# Patient Record
Sex: Male | Born: 1935 | Race: White | Hispanic: No | Smoking: Former smoker
Health system: Southern US, Community
[De-identification: ages and names within clinical notes are randomized; demographics above are authoritative.]

## PROBLEM LIST (undated history)

## (undated) DIAGNOSIS — I214 Non-ST elevation (NSTEMI) myocardial infarction: Secondary | ICD-10-CM

## (undated) DIAGNOSIS — I2510 Atherosclerotic heart disease of native coronary artery without angina pectoris: Secondary | ICD-10-CM

## (undated) DIAGNOSIS — I10 Essential (primary) hypertension: Secondary | ICD-10-CM

## (undated) DIAGNOSIS — F0390 Unspecified dementia without behavioral disturbance: Secondary | ICD-10-CM

## (undated) DIAGNOSIS — I429 Cardiomyopathy, unspecified: Secondary | ICD-10-CM

## (undated) HISTORY — DX: Non-ST elevation (NSTEMI) myocardial infarction: I21.4

## (undated) HISTORY — PX: APPENDECTOMY: SHX54

## (undated) HISTORY — DX: Cardiomyopathy, unspecified: I42.9

## (undated) HISTORY — DX: Unspecified dementia, unspecified severity, without behavioral disturbance, psychotic disturbance, mood disturbance, and anxiety: F03.90

## (undated) HISTORY — DX: Essential (primary) hypertension: I10

## (undated) HISTORY — DX: Atherosclerotic heart disease of native coronary artery without angina pectoris: I25.10

---

## 2012-03-27 HISTORY — PX: CORONARY ARTERY BYPASS GRAFT: SHX141

## 2012-04-16 ENCOUNTER — Encounter: Payer: Self-pay | Admitting: Cardiology

## 2012-04-17 ENCOUNTER — Encounter: Payer: Self-pay | Admitting: Cardiology

## 2012-04-19 DIAGNOSIS — I214 Non-ST elevation (NSTEMI) myocardial infarction: Secondary | ICD-10-CM

## 2012-04-19 HISTORY — DX: Non-ST elevation (NSTEMI) myocardial infarction: I21.4

## 2012-06-25 ENCOUNTER — Encounter: Payer: Self-pay | Admitting: Cardiology

## 2012-06-26 ENCOUNTER — Encounter: Payer: Self-pay | Admitting: Physician Assistant

## 2012-07-11 ENCOUNTER — Encounter: Payer: Self-pay | Admitting: Cardiology

## 2012-07-12 ENCOUNTER — Ambulatory Visit (INDEPENDENT_AMBULATORY_CARE_PROVIDER_SITE_OTHER): Payer: Medicare Other | Admitting: Cardiology

## 2012-07-12 ENCOUNTER — Encounter: Payer: Self-pay | Admitting: Cardiology

## 2012-07-12 VITALS — BP 151/83 | HR 64 | Ht 70.0 in | Wt 180.8 lb

## 2012-07-12 DIAGNOSIS — I10 Essential (primary) hypertension: Secondary | ICD-10-CM

## 2012-07-12 DIAGNOSIS — R9389 Abnormal findings on diagnostic imaging of other specified body structures: Secondary | ICD-10-CM

## 2012-07-12 DIAGNOSIS — I2789 Other specified pulmonary heart diseases: Secondary | ICD-10-CM

## 2012-07-12 DIAGNOSIS — R0602 Shortness of breath: Secondary | ICD-10-CM

## 2012-07-12 DIAGNOSIS — I429 Cardiomyopathy, unspecified: Secondary | ICD-10-CM

## 2012-07-12 DIAGNOSIS — I272 Pulmonary hypertension, unspecified: Secondary | ICD-10-CM

## 2012-07-12 DIAGNOSIS — I2510 Atherosclerotic heart disease of native coronary artery without angina pectoris: Secondary | ICD-10-CM | POA: Insufficient documentation

## 2012-07-12 NOTE — Patient Instructions (Addendum)
Your physician recommends that you schedule a follow-up appointment in: 3 months. Your physician recommends that you continue on your current medications as directed. Please refer to the Current Medication list given to you today. You have been referred to Dr. Orson Aloe for a pulmonary evaluation.

## 2012-07-12 NOTE — Progress Notes (Signed)
Clinical Summary Jose Henry is an 79 y.o.male referred to the office as a new patient by Jose Henry. He had had prior assessment by Jose Henry with an [facility] beginning in January when the patient presented with an out-of-hospital NSTEMI. He was managed conservatively and had a rest redistribution thallium study that per Jose Henry chart note indicated viable myocardium within the lateral wall, 50% tracer uptake in the inferior wall without redistribution. Unfortunately there is no formal report for me to review. Echocardiogram in January demonstrated LVEF 35-40% with inferolateral akinetic segments, mild left atrial enlargement, no significant MR, mild to moderate TR, RVSP 55-60 mm mercury.  Patient was transferred to [facility] and underwent cardiac catheterization demonstrating multivessel disease. This was followed by CABG including LIMA to the LAD, SVG to the first diagonal, and SVG to the first obtuse marginal with Jose Henry. He had a visit with Jose Henry in March and was referred for a followup echocardiogram that fortunately showed improvement in LVEF the range of 55-60%. He did have diastolic dysfunction, mild left atrial enlargement, severe right atrial enlargement, moderate to severe tricuspid regurgitation, and RVSP 50 mm mercury. Chest CT and examined the chest around that time did not demonstrate pulmonary embolus, but there were bilateral calcified pleural plaques suggestive of asbestos exposure.  Patient is here with a family member today. He indicates that he has been doing reasonably well. Has had some recent "cold" symptoms with intermittent wheezing. No productive cough. He reports compliance with his medications.   Allergies  Allergen Reactions  . Penicillins     Current Outpatient Prescriptions  Medication Sig Dispense Refill  . alfuzosin (UROXATRAL) 10 MG 24 hr tablet Take 10 mg by mouth at bedtime.      Marland Kitchen aspirin 325 MG tablet Take 325 mg by mouth  daily.      Marland Kitchen atorvastatin (LIPITOR) 80 MG tablet Take 80 mg by mouth daily.      . beta carotene w/minerals (OCUVITE) tablet Take 1 tablet by mouth daily.      Marland Kitchen donepezil (ARICEPT) 5 MG tablet Take 5 mg by mouth daily.      . ferrous gluconate (FERGON) 324 MG tablet Take 684 mg by mouth daily.      Marland Kitchen guaiFENesin-codeine (ROBITUSSIN AC) 100-10 MG/5ML syrup Take 5 mLs by mouth 4 (four) times daily as needed for cough.      Marland Kitchen lisinopril (PRINIVIL,ZESTRIL) 2.5 MG tablet Take 2.5 mg by mouth daily.      . metFORMIN (GLUCOPHAGE) 500 MG tablet Take 500 mg by mouth daily.      . metoprolol tartrate (LOPRESSOR) 25 MG tablet Take 75 mg by mouth 2 (two) times daily.      . sennosides-docusate sodium (SENOKOT-S) 8.6-50 MG tablet Take 2 tablets by mouth at bedtime.      . traMADol (ULTRAM) 50 MG tablet Take 50 mg by mouth every 6 (six) hours as needed for pain.      Marland Kitchen zolpidem (AMBIEN) 5 MG tablet Take 5 mg by mouth at bedtime as needed for sleep.       No current facility-administered medications for this visit.    Past Medical History  Diagnosis Date  . Essential hypertension, benign   . NSTEMI (non-ST elevated myocardial infarction) 04/19/2012    Out of hospital  . Dementia   . Cardiomyopathy     LVEF 35-40% initially  . Coronary atherosclerosis of native coronary artery     Multivessel  Past Surgical History  Procedure Laterality Date  . Appendectomy    . Coronary artery bypass graft  1/14     [facility] - LIMA to LAD, SVG to D1, SVG to OM1     Family History  Problem Relation Age of Onset  . Hypertension      Social History Jose Henry reports that he has quit smoking. His smoking use included Cigarettes. He smoked 0.00 packs per day. He does not have any smokeless tobacco history on file. Jose Henry reports that he does not drink alcohol.  Review of Systems No palpitations, no recent falls. Has to walk slowly intermittent unsteadiness. No  orthopnea or PND. Stable appetite. Hard of hearing. Otherwise negative.  Physical Examination Filed Vitals:   07/12/12 1003  BP: 151/83  Pulse: 64   Filed Weights   07/12/12 1003  Weight: 180 lb 12.8 oz (82.01 kg)   No acute distress. HEENT: Conjunctiva and lids normal, oropharynx clear. Neck: Supple, no elevated JVP, no thyromegaly. Lungs: Course, decreased throughout, nonlabored breathing at rest. Cardiac: Regular rate and rhythm, no S3, soft systolic murmur, no pericardial rub. Abdomen: Soft, nontender, bowel sounds present. Extremities: Trace edema, distal pulses 1-2+. Skin: Warm and dry. Musculoskeletal: No kyphosis. Neuropsychiatric: Alert and oriented x3, affect grossly appropriate.   Problem List and Plan   Coronary atherosclerosis of native coronary artery Extensive history reviewed today. Patient seems to be reasonably stable at this time on medical therapy. Medical regimen is reasonable, and he has had documentation of improved LVEF following revascularization. Plan will be continued activity as tolerated, follow up in 3 months.  Secondary cardiomyopathy, unspecified Recent echocardiogram shows improved LVEF up to the range of 55-60%.  Essential hypertension, benign Continue medical therapy and followup with Jose Henry.  Pulmonary hypertension Noted by echocardiogram, patient with moderate to severe TR, recent chest CTA not demonstrating any pulmonary embolus, but showing bilateral calcified pleural plaques. 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 No definite asbestos exposure. Plan to refer him for formal pulmonary consultation with Jose Henry.    Jose Henry, M.D., F.A.C.C.

## 2012-07-12 NOTE — Assessment & Plan Note (Signed)
Recent echocardiogram shows improved LVEF up to the range of 55-60%.

## 2012-07-12 NOTE — Assessment & Plan Note (Signed)
Noted by echocardiogram, patient with moderate to severe TR, recent chest CTA not demonstrating any pulmonary embolus, but showing bilateral calcified pleural plaques. 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 No definite asbestos exposure. Plan to refer him for formal pulmonary consultation with Dr. Orson Aloe.

## 2012-07-12 NOTE — Assessment & Plan Note (Signed)
Extensive history reviewed today. Patient seems to be reasonably stable at this time on medical therapy. Medical regimen is reasonable, and he has had documentation of improved LVEF following revascularization. Plan will be continued activity as tolerated, follow up in 3 months.

## 2012-07-12 NOTE — Assessment & Plan Note (Signed)
Continue medical therapy and followup with Dr. Sherryll Burger.

## 2012-10-11 ENCOUNTER — Ambulatory Visit (INDEPENDENT_AMBULATORY_CARE_PROVIDER_SITE_OTHER): Payer: Medicare Other | Admitting: Cardiology

## 2012-10-11 ENCOUNTER — Encounter: Payer: Self-pay | Admitting: Cardiology

## 2012-10-11 VITALS — BP 169/85 | HR 68 | Ht 70.0 in | Wt 189.4 lb

## 2012-10-11 DIAGNOSIS — I2789 Other specified pulmonary heart diseases: Secondary | ICD-10-CM

## 2012-10-11 DIAGNOSIS — Z01810 Encounter for preprocedural cardiovascular examination: Secondary | ICD-10-CM

## 2012-10-11 DIAGNOSIS — I10 Essential (primary) hypertension: Secondary | ICD-10-CM

## 2012-10-11 DIAGNOSIS — I2510 Atherosclerotic heart disease of native coronary artery without angina pectoris: Secondary | ICD-10-CM

## 2012-10-11 DIAGNOSIS — I272 Pulmonary hypertension, unspecified: Secondary | ICD-10-CM

## 2012-10-11 NOTE — Assessment & Plan Note (Signed)
Blood pressure is up today. Keep visits with Dr. Sherryll Burger. May need further up titration of ACE inhibitor.

## 2012-10-11 NOTE — Patient Instructions (Addendum)
Your physician recommends that you schedule a follow-up appointment in: 4 months. You will receive a reminder letter in the mail in about 1-2 months reminding you to call and schedule your appointment. If you don't receive this letter, please contact our office. Your physician recommends that you continue on your current medications as directed. Please refer to the Current Medication list given to you today. 

## 2012-10-11 NOTE — Progress Notes (Signed)
,   Clinical Summary [name] is an 79 y.o.male last seen back in April. He is here with a family member. He reports no angina symptoms, chronic shortness of breath, has a pending visit for pulmonary evaluation with Dr. Orson Henry next week.  He has assistance with his medications, reports compliance overall. He is having some trouble with sinus congestion and rhinorrhea. No cough, fevers or chills.  Echocardiogram from March of this year showed improved LVEF up to the range of 55-60%.  I received communication from [employer] at [employer] [id number]) regarding need for patient to undergo extraction of tooth #11. My presumption is this is going to be done using a regional anesthetic, although this was not outlined.   Allergies  Allergen Reactions  . Penicillins     Current Outpatient Prescriptions  Medication Sig Dispense Refill  . alfuzosin (UROXATRAL) 10 MG 24 hr tablet Take 10 mg by mouth at bedtime.      Marland Kitchen aspirin 325 MG tablet Take 325 mg by mouth daily.      Marland Kitchen atorvastatin (LIPITOR) 80 MG tablet Take 80 mg by mouth daily.      . beta carotene w/minerals (OCUVITE) tablet Take 1 tablet by mouth daily.      Marland Kitchen donepezil (ARICEPT) 5 MG tablet Take 5 mg by mouth daily.      . ferrous gluconate (FERGON) 324 MG tablet Take 684 mg by mouth daily.      Marland Kitchen guaiFENesin-codeine (ROBITUSSIN AC) 100-10 MG/5ML syrup Take 5 mLs by mouth 4 (four) times daily as needed for cough.      Marland Kitchen lisinopril (PRINIVIL,ZESTRIL) 2.5 MG tablet Take 2.5 mg by mouth daily.      . metFORMIN (GLUCOPHAGE) 500 MG tablet Take 500 mg by mouth daily.      . metoprolol tartrate (LOPRESSOR) 25 MG tablet Take 75 mg by mouth 2 (two) times daily.      . sennosides-docusate sodium (SENOKOT-S) 8.6-50 MG tablet Take 2 tablets by mouth at bedtime.      . traMADol (ULTRAM) 50 MG tablet Take 50 mg by mouth every 6 (six) hours as needed for pain.      . VENTOLIN HFA 108 (90 BASE) MCG/ACT inhaler Inhale 2 puffs  into the lungs every 6 (six) hours as needed.       . zolpidem (AMBIEN) 5 MG tablet Take 5 mg by mouth at bedtime as needed for sleep.       No current facility-administered medications for this visit.    Past Medical History  Diagnosis Date  . Essential hypertension, benign   . NSTEMI (non-ST elevated myocardial infarction) 04/19/2012    Out of hospital  . Dementia   . Cardiomyopathy     LVEF 35-40% initially  . Coronary atherosclerosis of native coronary artery     Multivessel    Past Surgical History  Procedure Laterality Date  . Appendectomy    . Coronary artery bypass graft  1/14     [facility] - LIMA to LAD, SVG to D1, SVG to OM1     Social History [name] reports that he has quit smoking. His smoking use included Cigarettes. He smoked 0.00 packs per day. He does not have any smokeless tobacco history on file. [name] reports that he does not drink alcohol.  Review of Systems No palpitations or syncope. No reported bleeding problems. Stable appetite. Difficulty sleeping. Otherwise negative.  Physical Examination Filed Vitals:   10/11/12  1434  BP: 169/85  Pulse: 68   Filed Weights   10/11/12 1434  Weight: 189 lb 6.4 oz (85.911 kg)    Comfortable at rest. HEENT: Conjunctiva and lids normal, oropharynx clear.  Neck: Supple, no elevated JVP, no thyromegaly.  Lungs: Course, decreased throughout, nonlabored breathing at rest.  Cardiac: Regular rate and rhythm, no S3, soft systolic murmur, no pericardial rub.  Abdomen: Soft, nontender, bowel sounds present.  Extremities: Trace edema, distal pulses 1-2+.  Skin: Warm and dry.  Musculoskeletal: No kyphosis.  Neuropsychiatric: Alert and oriented x3, affect grossly appropriate. Hard of hearing.  Problem List and Plan   Coronary atherosclerosis of native coronary artery Symptomatically stable, status post CABG with subsequent improvement in LV function to normal range assessed  by echocardiography in March of this year. Plan is to continue current medical regimen. Followup arranged.  Preoperative cardiovascular examination As noted above, patient is being considered for tooth extraction at [facility], presumably with a regional anesthetic. Generally this should be low risk as relates to cardiac disease. He does not require any prophylactic antibiotics status post CABG. If aspirin needs to be held temporary, this would also be reasonable. Would not anticipate any specific cardiac testing at this time.  Essential hypertension, benign Blood pressure is up today. Keep visits with Dr. Sherryll Henry. May need further up titration of ACE inhibitor.  Pulmonary hypertension Patient has pending visit with Dr. Orson Henry to assess pulmonary status.    Jonelle Sidle, M.D., F.A.C.C.

## 2012-10-11 NOTE — Assessment & Plan Note (Signed)
Patient has pending visit with Dr. Orson Aloe to assess pulmonary status.

## 2012-10-11 NOTE — Assessment & Plan Note (Signed)
Symptomatically stable, status post CABG with subsequent improvement in LV function to normal range assessed by echocardiography in March of this year. Plan is to continue current medical regimen. Followup arranged.

## 2012-10-11 NOTE — Assessment & Plan Note (Signed)
As noted above, patient is being considered for tooth extraction at [facility], presumably with a regional anesthetic. Generally this should be low risk as relates to cardiac disease. He does not require any prophylactic antibiotics status post CABG. If aspirin needs to be held temporary, this would also be reasonable. Would not anticipate any specific cardiac testing at this time.

## 2013-02-19 ENCOUNTER — Encounter: Payer: Self-pay | Admitting: Cardiology

## 2013-02-19 ENCOUNTER — Encounter: Payer: Medicare Other | Admitting: Cardiology

## 2013-02-19 NOTE — Progress Notes (Signed)
No show  This encounter was created in error - please disregard.

## 2013-04-24 ENCOUNTER — Ambulatory Visit (INDEPENDENT_AMBULATORY_CARE_PROVIDER_SITE_OTHER): Payer: Medicare Other | Admitting: Cardiology

## 2013-04-24 ENCOUNTER — Encounter: Payer: Self-pay | Admitting: Cardiology

## 2013-04-24 VITALS — BP 167/86 | HR 53 | Ht 70.0 in | Wt 194.0 lb

## 2013-04-24 DIAGNOSIS — I429 Cardiomyopathy, unspecified: Secondary | ICD-10-CM

## 2013-04-24 DIAGNOSIS — I2510 Atherosclerotic heart disease of native coronary artery without angina pectoris: Secondary | ICD-10-CM

## 2013-04-24 NOTE — Assessment & Plan Note (Signed)
Multivessel disease status post previous CABG, continue medical therapy and observation.

## 2013-04-24 NOTE — Progress Notes (Signed)
Clinical Summary Mr. Mayford Knifeurner is an 80 y.o.male last seen in July 2014. He is here with a family member today. He reports no chest pain, has medication assistance from family members. No hospitalizations since prior visit. Just had a recent office visit with Dr. Sherryll BurgerShah.  Echocardiogram from March 2014 showed improved LVEF up to the range of 55-60%. ECG today shows sinus bradycardia with prolonged PR interval.  We continue conservative medical management of his cardiac disease.   Allergies  Allergen Reactions  . Penicillins     Current Outpatient Prescriptions  Medication Sig Dispense Refill  . alfuzosin (UROXATRAL) 10 MG 24 hr tablet Take 10 mg by mouth at bedtime.      Marland Kitchen. aspirin 325 MG tablet Take 325 mg by mouth daily.      Marland Kitchen. atorvastatin (LIPITOR) 80 MG tablet Take 80 mg by mouth daily.      . beta carotene w/minerals (OCUVITE) tablet Take 1 tablet by mouth daily.      Marland Kitchen. donepezil (ARICEPT) 5 MG tablet Take 5 mg by mouth daily.      . ferrous gluconate (FERGON) 324 MG tablet Take 684 mg by mouth daily.      Marland Kitchen. guaiFENesin-codeine (ROBITUSSIN AC) 100-10 MG/5ML syrup Take 5 mLs by mouth 4 (four) times daily as needed for cough.      Marland Kitchen. lisinopril (PRINIVIL,ZESTRIL) 2.5 MG tablet Take 2.5 mg by mouth daily.      . metFORMIN (GLUCOPHAGE) 500 MG tablet Take 500 mg by mouth daily.      . metoprolol tartrate (LOPRESSOR) 25 MG tablet Take 75 mg by mouth 2 (two) times daily.      Marland Kitchen. NAMENDA 10 MG tablet       . sennosides-docusate sodium (SENOKOT-S) 8.6-50 MG tablet Take 2 tablets by mouth at bedtime.      . traMADol (ULTRAM) 50 MG tablet Take 50 mg by mouth every 6 (six) hours as needed for pain.      . VENTOLIN HFA 108 (90 BASE) MCG/ACT inhaler Inhale 2 puffs into the lungs every 6 (six) hours as needed.       . zolpidem (AMBIEN) 5 MG tablet Take 5 mg by mouth at bedtime as needed for sleep.       No current facility-administered medications for this visit.    Past Medical History    Diagnosis Date  . Essential hypertension, benign   . NSTEMI (non-ST elevated myocardial infarction) 04/19/2012    Out of hospital  . Dementia   . Cardiomyopathy     LVEF 35-40% initially  . Coronary atherosclerosis of native coronary artery     Multivessel    Past Surgical History  Procedure Laterality Date  . Appendectomy    . Coronary artery bypass [facility]  [facility]14     [facility] L[facility], SVG to D1, SVG to OM1     Social History Mr. Cura reports that he has quit smoking. His smoking use included Cigarettes. He smoked 0.00 packs per day. He does not have any smokeless tobacco history on file. Mr. Mayford Knifeurner reports that he does not drink alcohol.  Review of Systems No palpitations, dizziness, syncope. No bleeding episodes. Good appetite. Otherwise negative.  Physical Examination Filed Vitals:   04/24/13 1127  BP: 167/86  Pulse: 53   Filed Weights   04/24/13 1127  Weight: 194 lb (87.998 kg)    Appears comfortable at rest.  HEENT: Conjunctiva and lids normal,  oropharynx clear.  Neck: Supple, no elevated JVP, no thyromegaly.  Lungs: Course, decreased throughout, nonlabored breathing at rest.  Cardiac: Regular rate and rhythm, no S3, soft systolic murmur, no pericardial rub.  Abdomen: Soft, nontender, bowel sounds present.  Extremities: Trace edema, distal pulses 1-2+.    Problem List and Plan   Coronary atherosclerosis of native coronary artery Multivessel disease status post previous CABG, continue medical therapy and observation.  Secondary cardiomyopathy, unspecified Based on most recent evaluation in March 2014, LVEF has improved to the range of 55-60%.    Jonelle Sidle, M.D., F.A.C.C.

## 2013-04-24 NOTE — Assessment & Plan Note (Signed)
Based on most recent evaluation in March 2014, LVEF has improved to the range of 55-60%.

## 2013-04-24 NOTE — Patient Instructions (Signed)
Continue all current medications. Your physician wants you to follow up in: 6 months.  You will receive a reminder letter in the mail one-two months in advance.  If you don't receive a letter, please call our office to schedule the follow up appointment   

## 2013-08-01 ENCOUNTER — Other Ambulatory Visit: Payer: Self-pay | Admitting: *Deleted

## 2013-10-21 ENCOUNTER — Ambulatory Visit: Payer: Medicare Other | Admitting: Cardiology

## 2013-11-05 ENCOUNTER — Ambulatory Visit (INDEPENDENT_AMBULATORY_CARE_PROVIDER_SITE_OTHER): Payer: Medicare Other | Admitting: Cardiology

## 2013-11-05 ENCOUNTER — Encounter: Payer: Self-pay | Admitting: Cardiology

## 2013-11-05 VITALS — BP 122/74 | HR 68 | Ht 70.0 in | Wt 196.0 lb

## 2013-11-05 DIAGNOSIS — I10 Essential (primary) hypertension: Secondary | ICD-10-CM

## 2013-11-05 DIAGNOSIS — I2510 Atherosclerotic heart disease of native coronary artery without angina pectoris: Secondary | ICD-10-CM

## 2013-11-05 DIAGNOSIS — I429 Cardiomyopathy, unspecified: Secondary | ICD-10-CM

## 2013-11-05 NOTE — Patient Instructions (Signed)
Your physician recommends that you schedule a follow-up appointment in: 6 months. You will receive a reminder letter in the mail in about 4 months reminding you to call and schedule your appointment. If you don't receive this letter, please contact our office. Your physician has recommended you make the following change in your medication:  Decrease your ambien to 2.5 mg at bedtime. Please break your 5 mg tablet in half. Continue all other medications the same.

## 2013-11-05 NOTE — Progress Notes (Signed)
Clinical Summary [name] is an 80 y.o.male last seen in January. He is here with a family member. No specific complaint of angina. He feels generally weak, asked for something to "pep me up." States he does not sleep well without a sleep aid, then again his family member points out that he tends to be drowsy somewhat during the daytime as well.  Echocardiogram from March 2014 showed improved LVEF up to the range of 55-60%. ECG today shows sinus bradycardia with prolonged PR interval.  We plan to continue medical therapy and conservative management at this time.   Allergies  Allergen Reactions  . Penicillins     Current Outpatient Prescriptions  Medication Sig Dispense Refill  . alfuzosin (UROXATRAL) 10 MG 24 hr tablet Take 10 mg by mouth at bedtime.      Marland Kitchen. aspirin 325 MG tablet Take 325 mg by mouth daily.      Marland Kitchen. atorvastatin (LIPITOR) 80 MG tablet Take 80 mg by mouth daily.      . beta carotene w/minerals (OCUVITE) tablet Take 1 tablet by mouth daily.      Marland Kitchen. donepezil (ARICEPT) 5 MG tablet Take 5 mg by mouth daily.      . ferrous gluconate (FERGON) 324 MG tablet Take 684 mg by mouth daily.      Marland Kitchen. guaiFENesin-codeine (ROBITUSSIN AC) 100-10 MG/5ML syrup Take 5 mLs by mouth 4 (four) times daily as needed for cough.      Marland Kitchen. lisinopril (PRINIVIL,ZESTRIL) 2.5 MG tablet Take 2.5 mg by mouth daily.      . metFORMIN (GLUCOPHAGE) 500 MG tablet Take 500 mg by mouth daily.      . metoprolol tartrate (LOPRESSOR) 25 MG tablet Take 75 mg by mouth 2 (two) times daily.      Marland Kitchen. NAMENDA 10 MG tablet       . sennosides-docusate sodium (SENOKOT-S) 8.6-50 MG tablet Take 2 tablets by mouth at bedtime.      . traMADol (ULTRAM) 50 MG tablet Take 50 mg by mouth every 6 (six) hours as needed for pain.      . VENTOLIN HFA 108 (90 BASE) MCG/ACT inhaler Inhale 2 puffs into the lungs every 6 (six) hours as needed.       . zolpidem (AMBIEN) 5 MG tablet Take 0.5 tablets (2.5 mg total) by mouth at bedtime as  needed for sleep.  15 tablet  0   No current facility-administered medications for this visit.    Past Medical History  Diagnosis Date  . Essential hypertension, benign   . NSTEMI (non-ST elevated myocardial infarction) 04/19/2012    Out of hospital  . Dementia   . Cardiomyopathy     LVEF 35-40% initially  . Coronary atherosclerosis of native coronary artery     Multivessel    Past Surgical History  Procedure Laterality Date  . Appendectomy    . Coronary artery bypass graft  [facility]/14     [facility]A to LAD, SVG to D1, SVG to OM1     Social History Jose Henry reports that he quit smoking about 10 years ago. His smoking use included Cigarettes. He smoked 0.00 packs per day. He does not have any smokeless tobacco history on file. [name] reports that he does not drink alcohol.  Review of Systems Head no palpitations or syncope. Appetite is stable. He denies any falls or bleeding problems. Hard of hearing. Other systems reviewed and negative.   Physical  Examination Filed Vitals:   11/05/13 1511  BP: 122/74  Pulse: 68   Filed Weights   11/05/13 1511  Weight: 196 lb (88.905 kg)    Appears comfortable at rest.  HEENT: Conjunctiva and lids normal, oropharynx clear.  Neck: Supple, no elevated JVP, no thyromegaly.  Lungs: Course, decreased throughout, nonlabored breathing at rest.  Cardiac: Regular rate and rhythm, no S3, soft systolic murmur, no pericardial rub.  Abdomen: Soft, nontender, bowel sounds present.  Extremities: Trace edema, distal pulses 1-2+.    Problem List and Plan   Coronary atherosclerosis of native coronary artery No active angina symptoms on medical therapy. Continue observation.  Secondary cardiomyopathy, unspecified LVEF up to the range of 55-60% by echocardiogram last year.  Essential hypertension, benign Blood pressure is normal today.    Jonelle Sidle, M.D., F.A.C.C.

## 2013-11-05 NOTE — Assessment & Plan Note (Signed)
No active angina symptoms on medical therapy. Continue observation. 

## 2013-11-05 NOTE — Assessment & Plan Note (Signed)
Blood pressure is normal today. 

## 2013-11-05 NOTE — Assessment & Plan Note (Signed)
LVEF up to the range of 55-60% by echocardiogram last year.

## 2014-02-03 ENCOUNTER — Emergency Department (HOSPITAL_COMMUNITY)
Admission: EM | Admit: 2014-02-03 | Discharge: 2014-02-03 | Disposition: A | Discharge status: Home / Self Care | Payer: Medicare Other | Attending: Emergency Medicine | Admitting: Emergency Medicine

## 2014-02-03 ENCOUNTER — Encounter (HOSPITAL_COMMUNITY): Payer: Self-pay | Admitting: *Deleted

## 2014-02-03 ENCOUNTER — Emergency Department (HOSPITAL_COMMUNITY): Payer: Medicare Other

## 2014-02-03 DIAGNOSIS — Z951 Presence of aortocoronary bypass graft: Secondary | ICD-10-CM | POA: Diagnosis not present

## 2014-02-03 DIAGNOSIS — R079 Chest pain, unspecified: Secondary | ICD-10-CM | POA: Diagnosis present

## 2014-02-03 DIAGNOSIS — I10 Essential (primary) hypertension: Secondary | ICD-10-CM | POA: Insufficient documentation

## 2014-02-03 DIAGNOSIS — Z880 Allergy status to penicillin: Secondary | ICD-10-CM | POA: Insufficient documentation

## 2014-02-03 DIAGNOSIS — F0390 Unspecified dementia without behavioral disturbance: Secondary | ICD-10-CM | POA: Insufficient documentation

## 2014-02-03 DIAGNOSIS — Z7982 Long term (current) use of aspirin: Secondary | ICD-10-CM | POA: Diagnosis not present

## 2014-02-03 DIAGNOSIS — M7981 Nontraumatic hematoma of soft tissue: Secondary | ICD-10-CM | POA: Diagnosis not present

## 2014-02-03 DIAGNOSIS — Z87891 Personal history of nicotine dependence: Secondary | ICD-10-CM | POA: Diagnosis not present

## 2014-02-03 DIAGNOSIS — I2510 Atherosclerotic heart disease of native coronary artery without angina pectoris: Secondary | ICD-10-CM | POA: Diagnosis not present

## 2014-02-03 DIAGNOSIS — I252 Old myocardial infarction: Secondary | ICD-10-CM | POA: Insufficient documentation

## 2014-02-03 DIAGNOSIS — Z79899 Other long term (current) drug therapy: Secondary | ICD-10-CM | POA: Diagnosis not present

## 2014-02-03 LAB — CBC WITH DIFFERENTIAL/PLATELET
Basophils Absolute: 0 10*3/uL (ref 0.0–0.1)
Basophils Relative: 1 % (ref 0–1)
Eosinophils Absolute: 0.1 10*3/uL (ref 0.0–0.7)
Eosinophils Relative: 2 % (ref 0–5)
HCT: 40.7 % (ref 39.0–52.0)
HEMOGLOBIN: 13.5 g/dL (ref 13.0–17.0)
LYMPHS ABS: 1.6 10*3/uL (ref 0.7–4.0)
Lymphocytes Relative: 31 % (ref 12–46)
MCH: 31.8 pg (ref 26.0–34.0)
MCHC: 33.2 g/dL (ref 30.0–36.0)
MCV: 95.8 fL (ref 78.0–100.0)
Monocytes Absolute: 0.4 10*3/uL (ref 0.1–1.0)
Monocytes Relative: 8 % (ref 3–12)
Neutro Abs: 3 10*3/uL (ref 1.7–7.7)
Neutrophils Relative %: 58 % (ref 43–77)
PLATELETS: 231 10*3/uL (ref 150–400)
RBC: 4.25 MIL/uL (ref 4.22–5.81)
RDW: 13.6 % (ref 11.5–15.5)
WBC: 5.2 10*3/uL (ref 4.0–10.5)

## 2014-02-03 LAB — BASIC METABOLIC PANEL
Anion gap: 11 (ref 5–15)
BUN: 15 mg/dL (ref 6–23)
CO2: 30 mEq/L (ref 19–32)
Calcium: 9.3 mg/dL (ref 8.4–10.5)
Chloride: 102 mEq/L (ref 96–112)
Creatinine, Ser: 1.59 mg/dL — ABNORMAL HIGH (ref 0.50–1.35)
GFR calc Af Amer: 44 mL/min — ABNORMAL LOW (ref >90)
GFR calc non Af Amer: 38 mL/min — ABNORMAL LOW (ref >90)
GLUCOSE: 92 mg/dL (ref 70–99)
POTASSIUM: 4.5 meq/L (ref 3.7–5.3)
SODIUM: 143 meq/L (ref 137–147)

## 2014-02-03 MED ORDER — TRAMADOL HCL 50 MG PO TABS: 50.0000 mg | ORAL_TABLET | Freq: Four times a day (QID) | ORAL | Status: DC | PRN

## 2014-02-03 MED ORDER — OXYCODONE-ACETAMINOPHEN 5-325 MG PO TABS
1.0000 | ORAL_TABLET | Freq: Once | ORAL | Status: AC
  Administered 2014-02-03: 1 via ORAL
  Filled 2014-02-03: qty 1

## 2014-02-03 NOTE — ED Notes (Signed)
Pain lt lower ant chest. Contusion present.   Seen at Urgent Care yesterday and advised to go to ER.  Visitor with him says he has dementia.

## 2014-02-03 NOTE — ED Provider Notes (Signed)
CSN: [id number]     Arrival date & time 02/03/14  1134 History  This chart was scribed for Jose Co, MD by Milly Jakob, ED Scribe. The patient was seen in room APA14/APA14. Patient's care was started at 11:49 AM.     Chief Complaint  Patient presents with  . Chest Pain   The history is provided by the patient. No language interpreter was used.   HPI Comments: Jose Henry is a 80 y.o. male with a history of dementia who presents to the Emergency Department complaining of constant left suided pain and a blue bruise on the left side of his chest. The pain is exacerbated by breathing. He was seen at urgent care for this yesterday, and advised to come to the ER. He does not remember an injury due to his history of dementia. He does not smoke or drink.  Past Medical History  Diagnosis Date  . Essential hypertension, benign   . NSTEMI (non-ST elevated myocardial infarction) 04/19/2012    Out of hospital  . Dementia   . Cardiomyopathy     LVEF 35-40% initially  . Coronary atherosclerosis of native coronary artery     Multivessel   Past Surgical History  Procedure Laterality Date  . Appendectomy    . Coronary artery bypass graft  1/14     [facility] [place] to LAD, SVG to D1, SVG to OM1    Family History  Problem Relation Age of Onset  . Hypertension     History  Substance Use Topics  . Smoking status: Former Smoker    Types: Cigarettes    Quit date: 11/06/2003  . Smokeless tobacco: Not on file  . Alcohol Use: No    Review of Systems  Cardiovascular: Positive for chest pain.  Skin: Positive for color change (brusie on chest).  A complete 10 system review of systems was obtained and all systems are negative except as noted in the HPI and PMH.   Allergies  Penicillins  Home Medications   Prior to Admission medications   Medication Sig Start Date End Date Taking? Authorizing Provider  alfuzosin (UROXATRAL) 10 MG 24 hr tablet  Take 10 mg by mouth at bedtime.    Historical Provider, MD  aspirin 325 MG tablet Take 325 mg by mouth daily.    Historical Provider, MD  atorvastatin (LIPITOR) 80 MG tablet Take 80 mg by mouth daily.    Historical Provider, MD  beta carotene w/minerals (OCUVITE) tablet Take 1 tablet by mouth daily.    Historical Provider, MD  donepezil (ARICEPT) 5 MG tablet Take 5 mg by mouth daily.    Historical Provider, MD  ferrous gluconate (FERGON) 324 MG tablet Take 684 mg by mouth daily.    Historical Provider, MD  guaiFENesin-codeine (ROBITUSSIN AC) 100-10 MG/5ML syrup Take 5 mLs by mouth 4 (four) times daily as needed for cough.    Historical Provider, MD  lisinopril (PRINIVIL,ZESTRIL) 2.5 MG tablet Take 2.5 mg by mouth daily.    Historical Provider, MD  metFORMIN (GLUCOPHAGE) 500 MG tablet Take 500 mg by mouth daily.    Historical Provider, MD  metoprolol tartrate (LOPRESSOR) 25 MG tablet Take 75 mg by mouth 2 (two) times daily.    Historical Provider, MD  NAMENDA 10 MG tablet  04/17/13   Historical Provider, MD  sennosides-docusate sodium (SENOKOT-S) 8.6-50 MG tablet Take 2 tablets by mouth at bedtime.    Historical Provider, MD  traMADol Janean Sark)  50 MG tablet Take 50 mg by mouth every 6 (six) hours as needed for pain.    Historical Provider, MD  VENTOLIN HFA 108 (90 BASE) MCG/ACT inhaler Inhale 2 puffs into the lungs every 6 (six) hours as needed.  08/03/12   Historical Provider, MD  zolpidem (AMBIEN) 5 MG tablet Take 0.5 tablets (2.5 mg total) by mouth at bedtime as needed for sleep. 11/05/13   Jonelle SidleSamuel G McDowell, MD   Triage Vitals: BP 138/87 mmHg  Pulse 75  Temp(Src) 97.8 F (36.6 C) (Oral)  Resp 18  Ht 5\' 10"  (1.778 m)  Wt 180 lb (81.647 kg)  BMI 25.83 kg/m2  SpO2 97% Physical Exam  Constitutional: He is oriented to person, place, and time. He appears well-developed and well-nourished.  HENT:  Head: Normocephalic and atraumatic.  Eyes: EOM are normal.  Neck: Normal range of motion.   Cardiovascular: Normal rate, regular rhythm, normal heart sounds and intact distal pulses.   Pulmonary/Chest: Effort normal and breath sounds normal. No respiratory distress.  Abdominal: Soft. He exhibits no distension. There is no tenderness.  Musculoskeletal: Normal range of motion.  Neurological: He is alert and oriented to person, place, and time.  Skin: Skin is warm and dry.  Psychiatric: He has a normal mood and affect. Judgment normal.  Nursing note and vitals reviewed.   ED Course  Procedures (including critical care time) DIAGNOSTIC STUDIES: Oxygen Saturation is 97% on room air, normal by my interpretation.    COORDINATION OF CARE: 11:54 AM-Discussed treatment plan which includes CXR, pain medication, EKG, and lab work with pt at bedside and pt agreed to plan.   Labs Review Labs Reviewed  BASIC METABOLIC PANEL - Abnormal; Notable for the following:    Creatinine, Ser 1.59 (*)    GFR calc non Af Amer 38 (*)    GFR calc Af Amer 44 (*)    All other components within normal limits  CBC WITH DIFFERENTIAL    Imaging Review Dg Chest 2 View  02/03/2014   CLINICAL DATA:  Left-sided chest pain with bruising ; pleuritic chest pain ; history of dementia and pleural plaque disease  EXAM: CHEST  2 VIEW  COMPARISON:  Report of a chest X ray of April 16, 2012.  FINDINGS: The lungs are adequately inflated. There is no evidence of a pulmonary contusion, pneumothorax, or pleural effusion. There are calcified pleural plaques bilaterally. The cardiac silhouette is top-normal in size. The pulmonary vascularity is not engorged. The patient has undergone previous CABG. Seven intact sternal wires are present.  IMPRESSION: There is no acute cardiopulmonary abnormality. 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 No acute bony abnormality of the thorax is demonstrated.   Electronically Signed   By: David  [place]azil[place]   On: 02/03/2014 12:33  I personally reviewed the imaging tests through PACS  system I reviewed available ER/hospitalization records through the EMR    EKG Interpretation   Date/Time:  Tuesday February 03 2014 11:43:30 EST Ventricular Rate:  73 PR Interval:  253 QRS Duration: 80 QT Interval:  374 QTC Calculation: 412 R Axis:   63 Text Interpretation:  Sinus rhythm Prolonged PR interval Probable  anteroseptal infarct, old No old tracing to compare Confirmed by Daishon Chui   MD, Sivan Cuello ([id number]) on 02/03/2014 1:24:17 PM      MDM   Final diagnoses:  Chest pain    Bruising of left lateral chest wall.  No abdominal tenderness.  Chest x-ray without rib fractures or pneumothorax.  Home with a  short course of pain medication.  Patient does have dementia and without difficulty performing incentive spirometry as I instructed therefore he is at increased risk for pneumonia.  Pneumonia warnings given.   I personally performed the services described in this documentation, which was scribed in my presence. The recorded information has been reviewed and is accurate.      Jose CoKevin M Cristiano Capri, MD 02/03/14 [phone]

## 2014-02-03 NOTE — Discharge Instructions (Signed)

## 2014-05-15 ENCOUNTER — Encounter: Payer: Self-pay | Admitting: Cardiology

## 2014-05-15 ENCOUNTER — Ambulatory Visit (INDEPENDENT_AMBULATORY_CARE_PROVIDER_SITE_OTHER): Payer: Medicare Other | Admitting: Cardiology

## 2014-05-15 VITALS — BP 153/78 | HR 53 | Ht 70.0 in | Wt 195.0 lb

## 2014-05-15 DIAGNOSIS — I10 Essential (primary) hypertension: Secondary | ICD-10-CM

## 2014-05-15 DIAGNOSIS — I2510 Atherosclerotic heart disease of native coronary artery without angina pectoris: Secondary | ICD-10-CM

## 2014-05-15 DIAGNOSIS — E782 Mixed hyperlipidemia: Secondary | ICD-10-CM

## 2014-05-15 NOTE — Patient Instructions (Signed)

## 2014-05-15 NOTE — Progress Notes (Signed)
Cardiology Office Note  Date: 05/15/2014   ID: Jose Normaldridge B Mckay, DOB 03/01/1926, MRN [id number]  PCP: Kirstie PeriSHAH,ASHISH, MD  Primary Cardiologist: Nona DellSamuel Aleida Crandell, MD   Chief Complaint  Patient presents with  . Coronary Artery Disease  . Cardiomyopathy    History of Present Illness: Jose Henry is an 81 y.o. male last seen in August 2015. Interval records reviewed. He presents today with a family friend who helps take him to health care visits and other chores. Jose Henry does not endorse any angina symptoms, history is limited by dementia which seems to be progressing. He tells me that he still has trouble sleeping, I note that he is on Restoril per Dr. Sherryll BurgerShah. Otherwise his cardiac regimen has been stable, he gets assistance with his medications at home.  Interval ECG from November is reviewed below.  We continue conservative management of ischemic heart disease.   Past Medical History  Diagnosis Date  . Essential hypertension, benign   . NSTEMI (non-ST elevated myocardial infarction) 04/19/2012    Out of hospital  . Dementia   . Cardiomyopathy     LVEF 35-40% initially  . Coronary atherosclerosis of native coronary artery     Multivessel    Past Surgical History  Procedure Laterality Date  . Appendectomy    . Coronary artery bypass graft  1/14     [facility] [place] [facility], SVG to D1, SVG to OM1     Current Outpatient Prescriptions  Medication Sig Dispense Refill  . alfuzosin (UROXATRAL) 10 MG 24 hr tablet Take 10 mg by mouth at bedtime.    Marland Kitchen. aspirin EC 81 MG tablet Take 162 mg by mouth daily.    Marland Kitchen. atorvastatin (LIPITOR) 80 MG tablet Take 80 mg by mouth daily.    . ferrous gluconate (FERGON) 324 MG tablet Take 324 mg by mouth 2 (two) times daily with a meal.     . levothyroxine (SYNTHROID, LEVOTHROID) 88 MCG tablet Take 88 mcg by mouth daily before breakfast.    . lisinopril (PRINIVIL,ZESTRIL) 2.5 MG tablet Take 2.5 mg by mouth  daily.    . metFORMIN (GLUCOPHAGE) 500 MG tablet Take 500 mg by mouth daily.    . metoprolol tartrate (LOPRESSOR) 25 MG tablet Take 25 mg by mouth 2 (two) times daily.     Marland Kitchen. NAMENDA 10 MG tablet Take 10 mg by mouth daily.     . temazepam (RESTORIL) 15 MG capsule Take 15 mg by mouth at bedtime.    . traMADol (ULTRAM) 50 MG tablet Take 1 tablet (50 mg total) by mouth every 6 (six) hours as needed. 20 tablet 0  . VENTOLIN HFA 108 (90 BASE) MCG/ACT inhaler Inhale 2 puffs into the lungs every 6 (six) hours as needed for wheezing or shortness of breath.      No current facility-administered medications for this visit.    Allergies:  Penicillins   Social History: The patient  reports that he quit smoking about 10 years ago. His smoking use included Cigarettes. He does not have any smokeless tobacco history on file. He reports that he does not drink alcohol or use illicit drugs.   ROS:  Please see the history of present illness. Otherwise, complete review of systems is positive for insomnia, some unsteadiness when he walks, hard of hearing.  All other systems are reviewed and negative.    Physical Exam: VS:  BP 153/78 mmHg  Pulse 53  Ht 5\' 10"  (  1.778 m)  Wt 195 lb (88.451 kg)  BMI 27.98 kg/m2  SpO2 94%, BMI Body mass index is 27.98 kg/(m^2).  Wt Readings from Last 3 Encounters:  05/15/14 195 lb (88.451 kg)  02/03/14 180 lb (81.647 kg)  11/05/13 196 lb (88.905 kg)     Appears comfortable at rest.  HEENT: Conjunctiva and lids normal, oropharynx clear.  Neck: Supple, no elevated JVP, no thyromegaly.  Lungs: Course, decreased throughout, nonlabored breathing at rest.  Cardiac: Regular rate and rhythm, no S3, soft systolic murmur, no pericardial rub.  Abdomen: Soft, nontender, bowel sounds present.  Extremities: Trace edema, distal pulses 1-2+.    ECG: ECG is not ordered today.   Recent Labwork: 02/03/2014: BUN 15; Creatinine 1.59*; Hemoglobin 13.5; Platelets 231; Potassium 4.5;  Sodium 143   Other Studies Reviewed Today:  1. Echocardiogram ([facility]) 06/25/2012 reported mild LVH with LVEF 55-60%, grade 1 diastolic dysfunction, mild left atrial enlargement, moderate to severe right atrial enlargement, moderate to severe tricuspid regurgitation with RVSP 50 mmHg.  2. ECG from 02/04/2014 showed normal sinus rhythm with prolonged PR interval, possible old anteroseptal infarct pattern.  Assessment and Plan:  1. Multivessel CAD status post previous CABG with LVEF 55-60% as of 2014. Plan is to continue medical therapy and observation. He is on aspirin, Lipitor, lisinopril, and Lopressor.  2. Advancing dementia.  3. Hyperlipidemia, on Lipitor.  4. Essential hypertension, blood pressure is elevated today. Could consider further advancing lisinopril further depending on trend. Keep follow-up with Dr. Sherryll Burger.  Current medicines are reviewed at length with the patient today.  The patient does not have concerns regarding medicines.  Disposition: FU with me in 6 months.   Signed, Jonelle Sidle, MD, [facility] 05/15/2014 11:19 AM    [facility] at [facility] [address] [place], [place], [place] [address] Phone: [phone]; Fax: [phone]

## 2014-10-26 ENCOUNTER — Ambulatory Visit (INDEPENDENT_AMBULATORY_CARE_PROVIDER_SITE_OTHER): Payer: Medicare Other | Admitting: Cardiology

## 2014-10-26 ENCOUNTER — Encounter: Payer: Self-pay | Admitting: Cardiology

## 2014-10-26 VITALS — BP 112/70 | HR 81 | Ht 70.0 in | Wt 189.0 lb

## 2014-10-26 DIAGNOSIS — I10 Essential (primary) hypertension: Secondary | ICD-10-CM | POA: Diagnosis not present

## 2014-10-26 DIAGNOSIS — I2510 Atherosclerotic heart disease of native coronary artery without angina pectoris: Secondary | ICD-10-CM | POA: Diagnosis not present

## 2014-10-26 NOTE — Patient Instructions (Signed)
Your physician recommends that you continue on your current medications as directed. Please refer to the Current Medication list given to you today. Your physician recommends that you schedule a follow-up appointment in: 6 months. You will receive a reminder letter in the mail in about 4 months reminding you to call and schedule your appointment. If you don't receive this letter, please contact our office. 

## 2014-10-26 NOTE — Progress Notes (Signed)
Cardiology Office Note  Date: 10/26/2014   ID: CONER GIBBARD, DOB 1925/06/15, MRN [id number]  PCP: Kirstie Peri, MD  Primary Cardiologist: Nona Dell, MD   Chief Complaint  Patient presents with  . Coronary Artery Disease  . Cardiomyopathy    History of Present Illness: Jose Henry is an 81 y.o. male last seen in February. He is here today with an Geophysicist/field seismologist. History is limited by dementia, but he does not endorse any obvious angina symptoms, seems to be fairly sedentary based on discussion. We reviewed medications with assistant present. No major change in cardiac regimen.  We continue medical therapy for conservative management of ischemic heart disease.no interval hospitalizations reported.  Past Medical History  Diagnosis Date  . Essential hypertension, benign   . NSTEMI (non-ST elevated myocardial infarction) 04/19/2012    Out of hospital  . Dementia   . Cardiomyopathy     LVEF 35-40% initially  . Coronary atherosclerosis of native coronary artery     Multivessel    Past Surgical History  Procedure Laterality Date  . Appendectomy    . Coronary artery bypass graft  1/14     [facility] [place] to LAD, SVG to D1, SVG to OM1     Current Outpatient Prescriptions  Medication Sig Dispense Refill  . alfuzosin (UROXATRAL) 10 MG 24 hr tablet Take 10 mg by mouth at bedtime.    Marland Kitchen aspirin EC 81 MG tablet Take 81 mg by mouth daily.     Marland Kitchen atorvastatin (LIPITOR) 80 MG tablet Take 80 mg by mouth daily.    . ferrous gluconate (FERGON) 324 MG tablet Take 324 mg by mouth 2 (two) times daily with a meal.     . levothyroxine (SYNTHROID, LEVOTHROID) 88 MCG tablet Take 88 mcg by mouth daily before breakfast.    . lisinopril (PRINIVIL,ZESTRIL) 5 MG tablet Take 5 mg by mouth daily.    . Melatonin 10 MG TABS Take by mouth.    . metFORMIN (GLUCOPHAGE) 500 MG tablet Take 500 mg by mouth daily.    Marland Kitchen NAMENDA 10 MG tablet Take 10 mg by mouth  daily.     . temazepam (RESTORIL) 15 MG capsule Take 15 mg by mouth at bedtime.    . VENTOLIN HFA 108 (90 BASE) MCG/ACT inhaler Inhale 2 puffs into the lungs every 6 (six) hours as needed for wheezing or shortness of breath.      No current facility-administered medications for this visit.    Allergies:  Penicillins   Social History: The patient  reports that he quit smoking about 10 years ago. His smoking use included Cigarettes. He has never used smokeless tobacco. He reports that he does not drink alcohol or use illicit drugs.   ROS:  Please see the history of present illness. Otherwise, complete review of systems is positive for decreased hearing.  All other systems are reviewed and negative.   Physical Exam: VS:  BP 112/70 mmHg  Pulse 81  Ht  (1.778 m)  Wt 189 lb (85.73 kg)  BMI 27.12 kg/m2  SpO2 94%, BMI Body mass index is 27.12 kg/(m^2).  Wt Readings from Last 3 Encounters:  10/26/14 189 lb (85.73 kg)  05/15/14 195 lb (88.451 kg)  02/03/14 180 lb (81.647 kg)    Appears comfortable at rest.  HEENT: Conjunctiva and lids normal, oropharynx clear.  Neck: Supple, no elevated JVP, no thyromegaly.  Lungs: Course, decreased throughout, nonlabored breathing at rest.  Cardiac: Regular rate and rhythm, no S3, soft systolic murmur, no pericardial rub.  Abdomen: Soft, nontender, bowel sounds present.  Extremities: Trace edema, distal pulses 1-2+.  ECG: ECG is not ordered today.   Recent Labwork: 02/03/2014: BUN 15; Creatinine, Ser 1.59*; Hemoglobin 13.5; Platelets 231; Potassium 4.5; Sodium 143   Other Studies Reviewed Today:  1. Echocardiogram ([facility]) 06/25/2012 reported mild LVH with LVEF 55-60%, grade 1 diastolic dysfunction, mild left atrial enlargement, moderate to severe right atrial enlargement, moderate to severe tricuspid regurgitation with RVSP 50 mmHg.  2. ECG from 02/04/2014 showed normal sinus rhythm with prolonged PR interval, possible old anteroseptal  infarct pattern.  Assessment and Plan:  1. CAD status post CABG in 2014. He has evidence of ischemic cardiomyopathy which is stable on medical therapy. Continue observation.  2. Essential hypertension, blood pressure is normal today.  Current medicines were reviewed with the patient today.  Disposition: FU with me in 6 months.   Signed, Jonelle Sidle, MD, [facility] 10/26/2014 11:21 AM    [facility] at [facility] [address] [place], [place], [place] [address] Phone: [phone]; Fax: [phone]

## 2015-04-21 ENCOUNTER — Encounter: Payer: Self-pay | Admitting: Cardiology

## 2015-04-21 ENCOUNTER — Ambulatory Visit (INDEPENDENT_AMBULATORY_CARE_PROVIDER_SITE_OTHER): Payer: Medicare Other | Admitting: Cardiology

## 2015-04-21 VITALS — BP 130/71 | HR 90 | Ht 70.0 in | Wt 183.8 lb

## 2015-04-21 DIAGNOSIS — I2510 Atherosclerotic heart disease of native coronary artery without angina pectoris: Secondary | ICD-10-CM | POA: Diagnosis not present

## 2015-04-21 DIAGNOSIS — I10 Essential (primary) hypertension: Secondary | ICD-10-CM

## 2015-04-21 NOTE — Progress Notes (Signed)
Cardiology Office Note  Date: 04/21/2015   ID: Jose Henry, DOB [birth year], MRN [id number]  PCP: Kirstie Peri, MD  Primary Cardiologist: Nona Dell, MD   Chief Complaint  Patient presents with  . Coronary Artery Disease    History of Present Illness: Jose Henry is an 82 y.o. male last seen in August 2016. He is here today with an assistant for a routine follow-up visit. Still lives and asked in [place]. He is limited by dementia. I asked today about chest pain or worsening shortness of breath, and he denied both.  I reviewed his medications which are outlined below an stable from a cardiac perspective.  Regimen includes aspirin, Vytorin, lisinopril, and nifedipine. ECG ordered an reviewed by me today shows normal sinus rhythm.  Past Medical History  Diagnosis Date  . Essential hypertension, benign   . NSTEMI (non-ST elevated myocardial infarction) (HCC) 04/19/2012    Out of hospital  . Dementia   . Cardiomyopathy (HCC)     LVEF 35-40% initially  . Coronary atherosclerosis of native coronary artery     Multivessel    Past Surgical History  Procedure Laterality Date  . Appendectomy    . Coronary artery bypass graft  1/14     [facility] [place] to [place], SVG to D1, SVG to OM1     Current Outpatient Prescriptions  Medication Sig Dispense Refill  . alfuzosin (UROXATRAL) 10 MG 24 hr tablet Take 10 mg by mouth at bedtime.    Marland Kitchen aspirin EC 81 MG tablet Take 81 mg by mouth daily.     Marland Kitchen ezetimibe-simvastatin (VYTORIN) 10-40 MG tablet Take 1 tab Daily.    . ferrous gluconate (FERGON) 324 MG tablet Take 324 mg by mouth 2 (two) times daily with a meal.     . levothyroxine (SYNTHROID, LEVOTHROID) 88 MCG tablet Take 88 mcg by mouth daily before breakfast.    . lisinopril (PRINIVIL,ZESTRIL) 5 MG tablet Take 5 mg by mouth daily.    . meclizine (ANTIVERT) 25 MG tablet 1 by mouth q 6 hours prn    . Melatonin 10 MG TABS Take 1 tablet by  mouth at bedtime.     . metFORMIN (GLUCOPHAGE) 500 MG tablet Take 500 mg by mouth daily.    Marland Kitchen NAMENDA 10 MG tablet Take 10 mg by mouth daily.     Marland Kitchen NIFEdipine (PROCARDIA-XL/ADALAT CC) 60 MG 24 hr tablet Take 1.5 tablets by mouth daily.    . traZODone (DESYREL) 150 MG tablet 1 by mouth at bedtime    . VENTOLIN HFA 108 (90 BASE) MCG/ACT inhaler Inhale 2 puffs into the lungs every 6 (six) hours as needed for wheezing or shortness of breath.      No current facility-administered medications for this visit.   Allergies:  Penicillins   Social History: The patient  reports that he quit smoking about 11 years ago. His smoking use included Cigarettes. He has never used smokeless tobacco. He reports that he does not drink alcohol or use illicit drugs.   ROS:  Please see the history of present illness. Otherwise, complete review of systems is positive for none.  All other systems are reviewed and negative.   Physical Exam: VS:  BP 130/71 mmHg  Pulse 90  Ht  (1.778 m)  Wt 183 lb 12.8 oz (83.371 kg)  BMI 26.37 kg/m2  SpO2 93%, BMI Body mass index is 26.37 kg/(m^2).  Wt Readings from Last 3 Encounters:  04/21/15 183 lb 12.8 oz (83.371 kg)  10/26/14 189 lb (85.73 kg)  05/15/14 195 lb (88.451 kg)    Appears comfortable at rest.  HEENT: Conjunctiva and lids normal, oropharynx clear.  Neck: Supple, no elevated JVP, no thyromegaly.  Lungs: Course, decreased throughout, nonlabored breathing at rest.  Cardiac: Regular rate and rhythm, no S3, soft systolic murmur, no pericardial rub.  Abdomen: Soft, nontender, bowel sounds present.  Extremities: Trace edema, distal pulses 1-2+.  ECG:  I reviewed the previous tracing from 02/03/2014 which showed sinus rhythm with PR prolongation, rule out old anteroseptal infarct pattern.  Recent Labwork:  November 2015: Hemoglobin 13.5, platelets 231, potassium 4.5, BUN 15, creatinine 1.5  Other Studies Reviewed Today:  Echocardiogram ([facility])  06/25/2012 reported mild LVH with LVEF 55-60%, grade 1 diastolic dysfunction, mild left atrial enlargement, moderate to severe right atrial enlargement, moderate to severe tricuspid regurgitation with RVSP 50 mmHg.  Assessment and Plan:  1. Multivessel CAD status post CABG in 2014. He remains symptomatically stable on medical therapy. ECG is normal. Continue observation.  2. History of ischemic cardiomyopathy with normalization of LVEF by last echocardiogram. Weight is relatively stable, no clear heart failure symptoms.  3. Essential hypertension , blood pressure is adequately controlled today.  Current medicines were reviewed with the patient today.   Orders Placed This Encounter  Procedures  . EKG 12-Lead    Disposition: FU with me in 6 months.   Signed, Jonelle Sidle, MD, [facility] 04/21/2015 11:17 AM    [facility] at [facility] [address] [place], [place], [place] [address] Phone: [phone]; Fax: [phone]

## 2015-04-21 NOTE — Patient Instructions (Signed)
Your physician recommends that you continue on your current medications as directed. Please refer to the Current Medication list given to you today. Your physician recommends that you schedule a follow-up appointment in: 6 months. You will receive a reminder letter in the mail in about 4 months reminding you to call and schedule your appointment. If you don't receive this letter, please contact our office.  Please contact Dr. Margaretmary Eddy office for an appointment. [phone]

## 2015-04-28 DEATH — deceased

## 2016-08-10 IMAGING — CR DG CHEST 2V
2 series · 2 of 2 positions shown · non-contrast
Comparison: Report of a chest X ray April 16, 2012.

CLINICAL DATA: Left-sided chest pain with bruising ; pleuritic
chest pain ; history of dementia and pleural plaque disease

EXAM:
CHEST  2 VIEW

[view not recorded (1 of 2)]
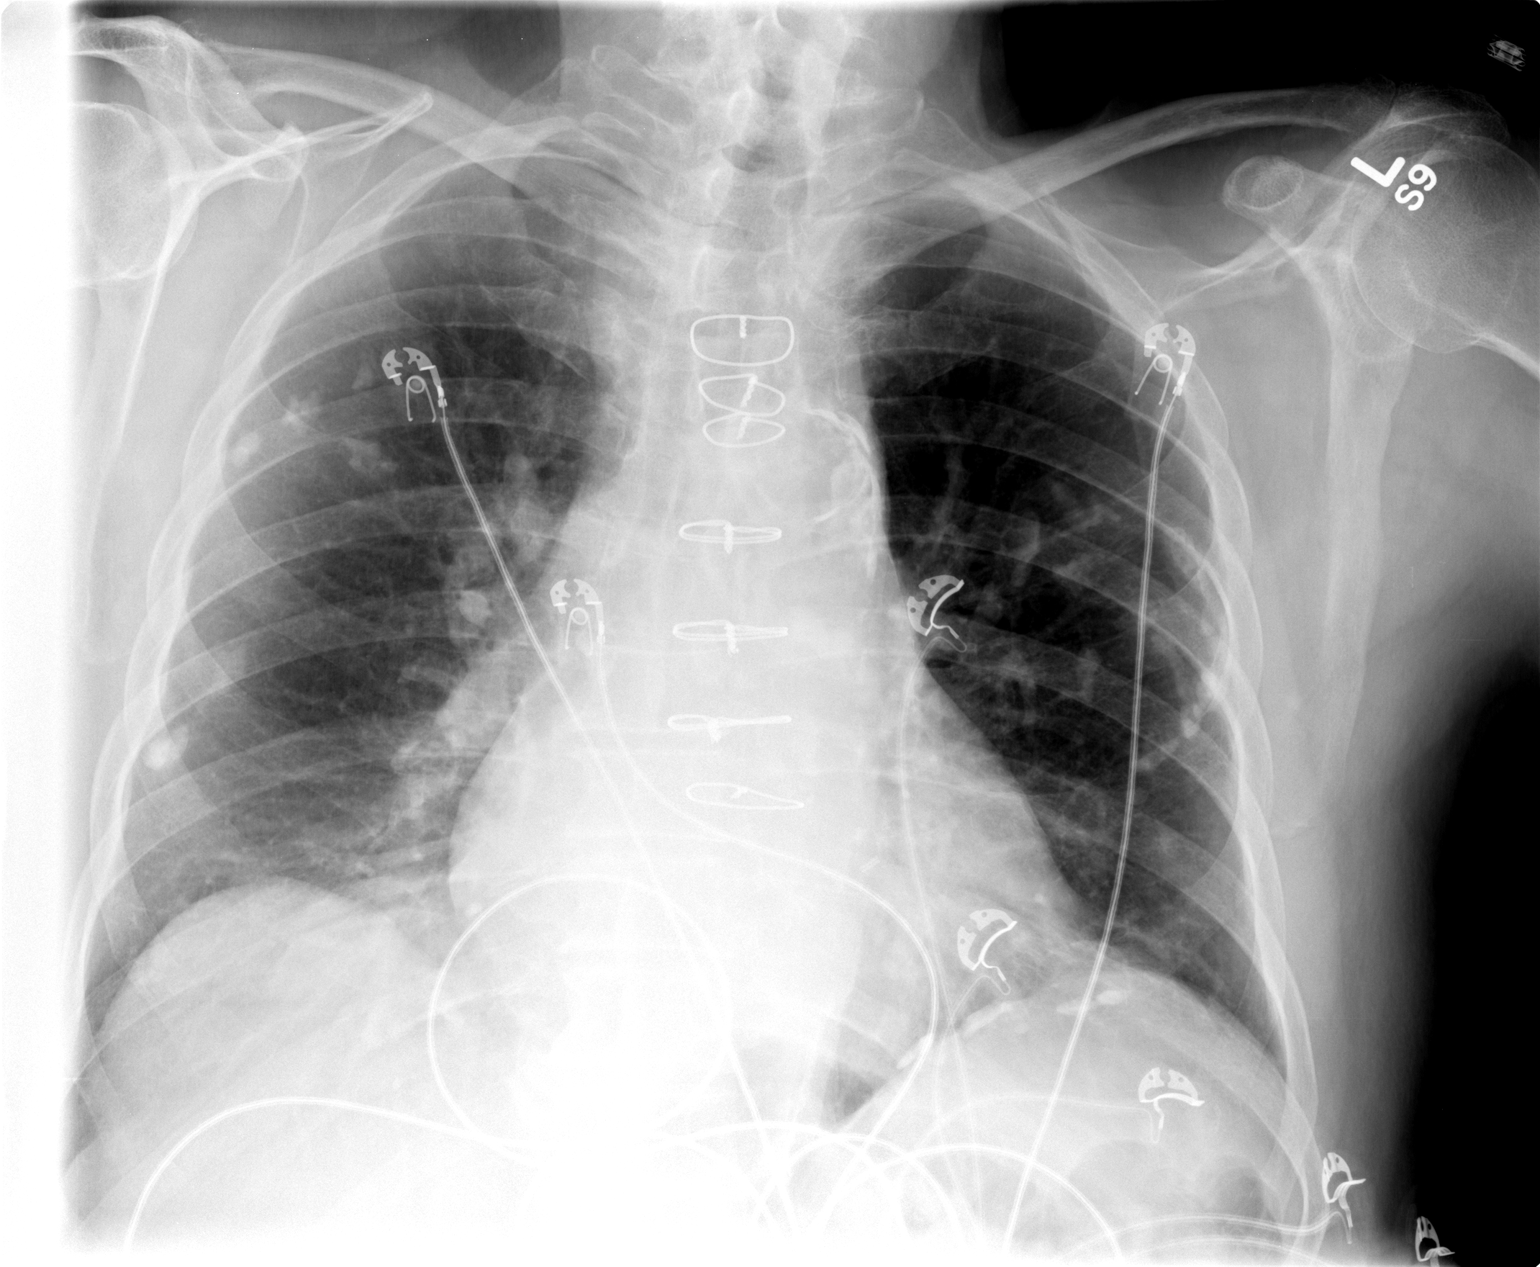

[view not recorded (2 of 2)]
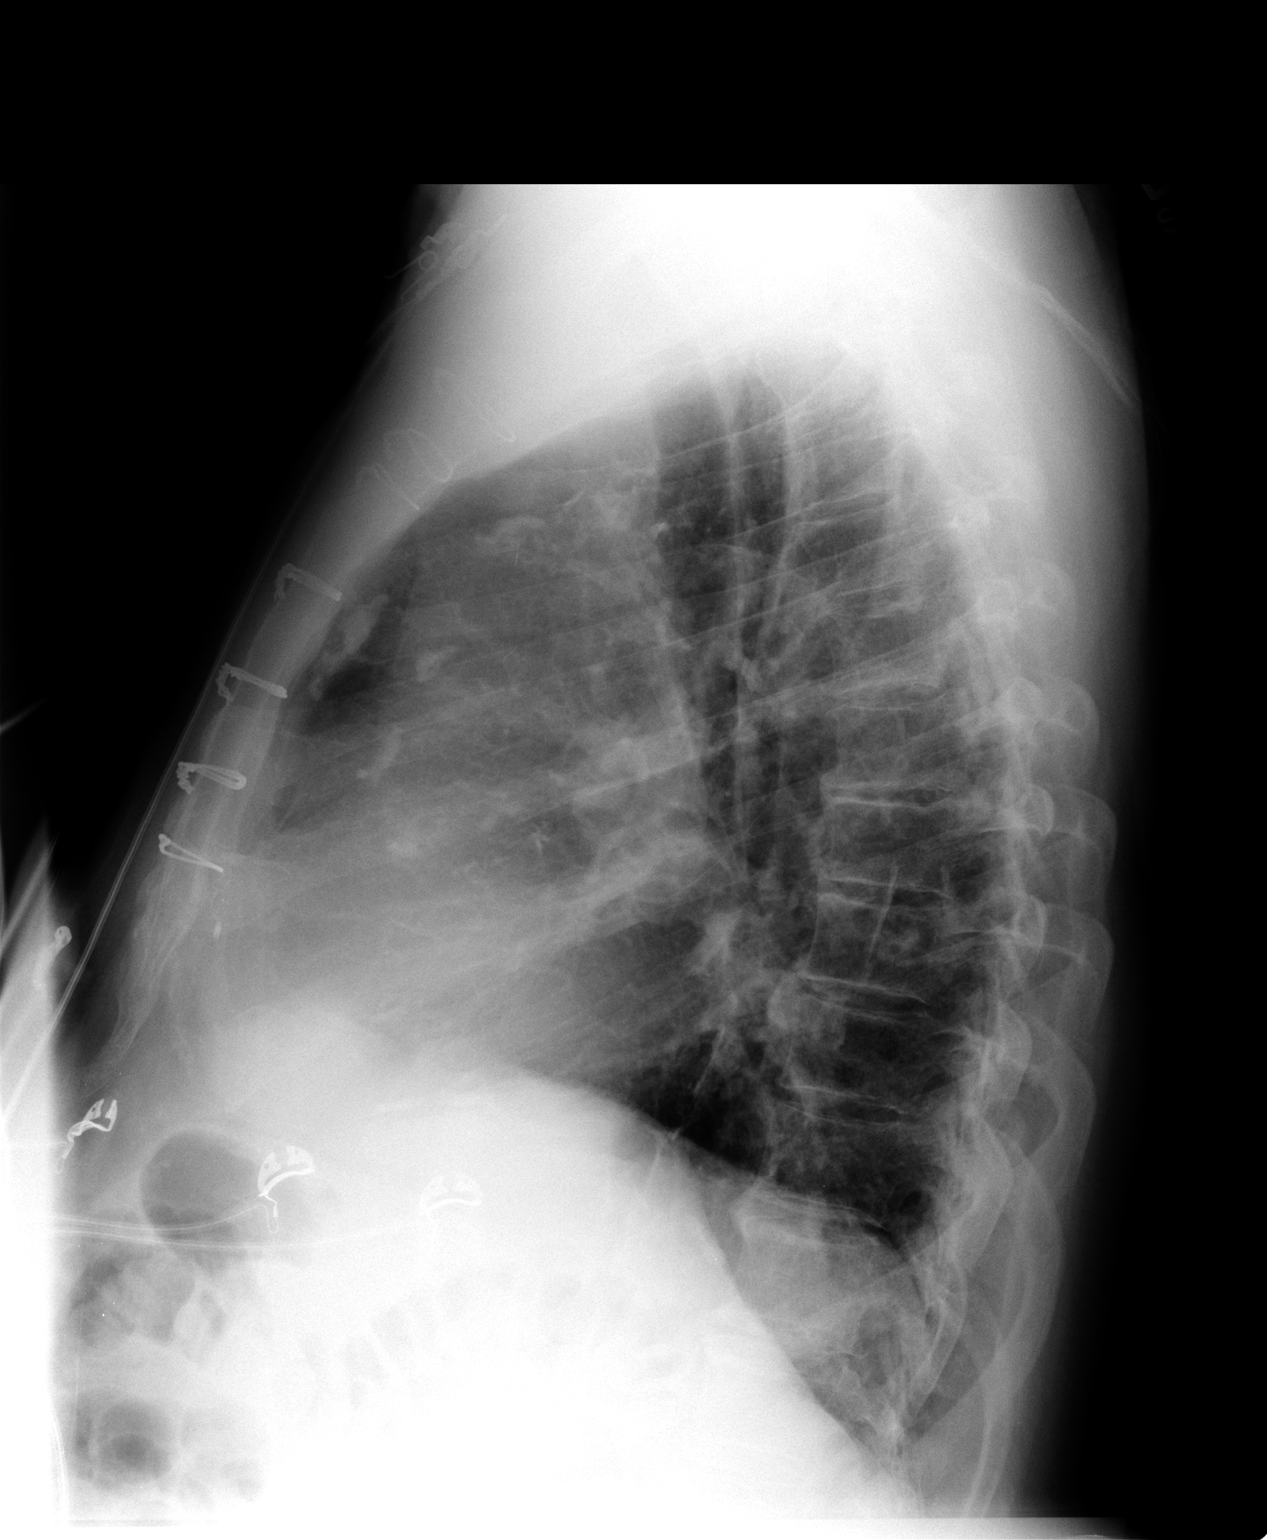

[2 of 2 positions shown; findings below may reference images not displayed]

FINDINGS: The lungs are adequately inflated. There is no evidence of a
pulmonary contusion, pneumothorax, or pleural effusion. There are
calcified pleural plaques bilaterally. The cardiac silhouette is
top-normal in size. The pulmonary vascularity is not engorged. The
patient has undergone previous CABG. Seven intact sternal wires are
present.
IMPRESSION: There is no acute cardiopulmonary abnormality. Chronic pleural
plaque disease is demonstrated. No acute bony abnormality of the
thorax is demonstrated.
